# Patient Record
Sex: Male | Born: 1960 | Race: White | Hispanic: No
Health system: Southern US, Community
[De-identification: ages and names within clinical notes are randomized; demographics above are authoritative.]

---

## 2015-10-05 DIAGNOSIS — M19172 Post-traumatic osteoarthritis, left ankle and foot: Secondary | ICD-10-CM | POA: Diagnosis not present

## 2015-10-05 DIAGNOSIS — M19072 Primary osteoarthritis, left ankle and foot: Secondary | ICD-10-CM | POA: Diagnosis not present

## 2015-10-05 DIAGNOSIS — I10 Essential (primary) hypertension: Secondary | ICD-10-CM | POA: Diagnosis not present

## 2015-10-05 DIAGNOSIS — M12572 Traumatic arthropathy, left ankle and foot: Secondary | ICD-10-CM | POA: Diagnosis not present

## 2015-10-05 DIAGNOSIS — M21172 Varus deformity, not elsewhere classified, left ankle: Secondary | ICD-10-CM | POA: Diagnosis not present

## 2015-10-05 DIAGNOSIS — M216X2 Other acquired deformities of left foot: Secondary | ICD-10-CM | POA: Diagnosis not present

## 2015-10-05 DIAGNOSIS — M25572 Pain in left ankle and joints of left foot: Secondary | ICD-10-CM | POA: Diagnosis not present

## 2015-10-21 DIAGNOSIS — F419 Anxiety disorder, unspecified: Secondary | ICD-10-CM | POA: Diagnosis not present

## 2015-10-21 DIAGNOSIS — G894 Chronic pain syndrome: Secondary | ICD-10-CM | POA: Diagnosis not present

## 2015-10-21 DIAGNOSIS — M25579 Pain in unspecified ankle and joints of unspecified foot: Secondary | ICD-10-CM | POA: Diagnosis not present

## 2015-10-21 DIAGNOSIS — M129 Arthropathy, unspecified: Secondary | ICD-10-CM | POA: Diagnosis not present

## 2015-10-26 DIAGNOSIS — M216X2 Other acquired deformities of left foot: Secondary | ICD-10-CM | POA: Diagnosis not present

## 2015-10-26 DIAGNOSIS — Z79899 Other long term (current) drug therapy: Secondary | ICD-10-CM | POA: Diagnosis not present

## 2015-10-26 DIAGNOSIS — K219 Gastro-esophageal reflux disease without esophagitis: Secondary | ICD-10-CM | POA: Diagnosis not present

## 2015-10-26 DIAGNOSIS — M19172 Post-traumatic osteoarthritis, left ankle and foot: Secondary | ICD-10-CM | POA: Diagnosis not present

## 2015-10-26 DIAGNOSIS — G629 Polyneuropathy, unspecified: Secondary | ICD-10-CM | POA: Diagnosis not present

## 2015-10-26 DIAGNOSIS — M24572 Contracture, left ankle: Secondary | ICD-10-CM | POA: Diagnosis not present

## 2015-10-26 DIAGNOSIS — Z87442 Personal history of urinary calculi: Secondary | ICD-10-CM | POA: Diagnosis not present

## 2015-10-26 DIAGNOSIS — Z791 Long term (current) use of non-steroidal anti-inflammatories (NSAID): Secondary | ICD-10-CM | POA: Diagnosis not present

## 2015-10-26 DIAGNOSIS — R000 Tachycardia, unspecified: Secondary | ICD-10-CM | POA: Diagnosis not present

## 2015-10-26 DIAGNOSIS — I10 Essential (primary) hypertension: Secondary | ICD-10-CM | POA: Diagnosis not present

## 2015-10-28 DIAGNOSIS — M12572 Traumatic arthropathy, left ankle and foot: Secondary | ICD-10-CM | POA: Diagnosis not present

## 2015-10-28 DIAGNOSIS — M21172 Varus deformity, not elsewhere classified, left ankle: Secondary | ICD-10-CM | POA: Diagnosis not present

## 2015-10-28 DIAGNOSIS — M19071 Primary osteoarthritis, right ankle and foot: Secondary | ICD-10-CM | POA: Diagnosis not present

## 2015-10-28 DIAGNOSIS — M24572 Contracture, left ankle: Secondary | ICD-10-CM | POA: Diagnosis not present

## 2015-10-28 DIAGNOSIS — M216X2 Other acquired deformities of left foot: Secondary | ICD-10-CM | POA: Diagnosis not present

## 2015-10-28 DIAGNOSIS — M19172 Post-traumatic osteoarthritis, left ankle and foot: Secondary | ICD-10-CM | POA: Diagnosis not present

## 2015-10-28 DIAGNOSIS — G8918 Other acute postprocedural pain: Secondary | ICD-10-CM | POA: Diagnosis not present

## 2015-10-29 DIAGNOSIS — M19071 Primary osteoarthritis, right ankle and foot: Secondary | ICD-10-CM | POA: Diagnosis not present

## 2015-10-29 DIAGNOSIS — M12572 Traumatic arthropathy, left ankle and foot: Secondary | ICD-10-CM | POA: Diagnosis not present

## 2015-10-29 DIAGNOSIS — G8918 Other acute postprocedural pain: Secondary | ICD-10-CM | POA: Diagnosis not present

## 2015-10-29 DIAGNOSIS — M216X2 Other acquired deformities of left foot: Secondary | ICD-10-CM | POA: Diagnosis not present

## 2015-10-31 DIAGNOSIS — K219 Gastro-esophageal reflux disease without esophagitis: Secondary | ICD-10-CM | POA: Diagnosis not present

## 2015-10-31 DIAGNOSIS — G9341 Metabolic encephalopathy: Secondary | ICD-10-CM | POA: Diagnosis not present

## 2015-10-31 DIAGNOSIS — Z87898 Personal history of other specified conditions: Secondary | ICD-10-CM | POA: Diagnosis not present

## 2015-10-31 DIAGNOSIS — M25561 Pain in right knee: Secondary | ICD-10-CM | POA: Diagnosis not present

## 2015-10-31 DIAGNOSIS — G8918 Other acute postprocedural pain: Secondary | ICD-10-CM | POA: Diagnosis not present

## 2015-10-31 DIAGNOSIS — W19XXXA Unspecified fall, initial encounter: Secondary | ICD-10-CM | POA: Diagnosis not present

## 2015-10-31 DIAGNOSIS — J690 Pneumonitis due to inhalation of food and vomit: Secondary | ICD-10-CM | POA: Diagnosis not present

## 2015-10-31 DIAGNOSIS — I447 Left bundle-branch block, unspecified: Secondary | ICD-10-CM | POA: Diagnosis not present

## 2015-10-31 DIAGNOSIS — M25572 Pain in left ankle and joints of left foot: Secondary | ICD-10-CM | POA: Diagnosis not present

## 2015-10-31 DIAGNOSIS — G4733 Obstructive sleep apnea (adult) (pediatric): Secondary | ICD-10-CM | POA: Diagnosis not present

## 2015-10-31 DIAGNOSIS — N179 Acute kidney failure, unspecified: Secondary | ICD-10-CM | POA: Diagnosis not present

## 2015-10-31 DIAGNOSIS — A419 Sepsis, unspecified organism: Secondary | ICD-10-CM | POA: Diagnosis not present

## 2015-10-31 DIAGNOSIS — I10 Essential (primary) hypertension: Secondary | ICD-10-CM | POA: Diagnosis not present

## 2015-10-31 DIAGNOSIS — J9601 Acute respiratory failure with hypoxia: Secondary | ICD-10-CM | POA: Diagnosis not present

## 2015-10-31 DIAGNOSIS — I248 Other forms of acute ischemic heart disease: Secondary | ICD-10-CM | POA: Diagnosis not present

## 2015-10-31 DIAGNOSIS — M6282 Rhabdomyolysis: Secondary | ICD-10-CM | POA: Diagnosis not present

## 2015-10-31 DIAGNOSIS — G9340 Encephalopathy, unspecified: Secondary | ICD-10-CM | POA: Diagnosis not present

## 2015-10-31 DIAGNOSIS — R6520 Severe sepsis without septic shock: Secondary | ICD-10-CM | POA: Diagnosis not present

## 2015-10-31 DIAGNOSIS — G8929 Other chronic pain: Secondary | ICD-10-CM | POA: Diagnosis not present

## 2015-10-31 DIAGNOSIS — M25551 Pain in right hip: Secondary | ICD-10-CM | POA: Diagnosis not present

## 2015-10-31 DIAGNOSIS — M19172 Post-traumatic osteoarthritis, left ankle and foot: Secondary | ICD-10-CM | POA: Diagnosis not present

## 2015-10-31 DIAGNOSIS — F419 Anxiety disorder, unspecified: Secondary | ICD-10-CM | POA: Diagnosis not present

## 2015-11-01 DIAGNOSIS — J9601 Acute respiratory failure with hypoxia: Secondary | ICD-10-CM | POA: Diagnosis not present

## 2015-11-01 DIAGNOSIS — G8918 Other acute postprocedural pain: Secondary | ICD-10-CM | POA: Diagnosis not present

## 2015-11-01 DIAGNOSIS — G9341 Metabolic encephalopathy: Secondary | ICD-10-CM | POA: Diagnosis not present

## 2015-11-01 DIAGNOSIS — J690 Pneumonitis due to inhalation of food and vomit: Secondary | ICD-10-CM | POA: Diagnosis not present

## 2015-11-01 DIAGNOSIS — G8929 Other chronic pain: Secondary | ICD-10-CM | POA: Diagnosis not present

## 2015-11-01 DIAGNOSIS — N179 Acute kidney failure, unspecified: Secondary | ICD-10-CM | POA: Diagnosis not present

## 2015-11-01 DIAGNOSIS — M25572 Pain in left ankle and joints of left foot: Secondary | ICD-10-CM | POA: Diagnosis not present

## 2015-11-01 DIAGNOSIS — A419 Sepsis, unspecified organism: Secondary | ICD-10-CM | POA: Diagnosis not present

## 2015-11-01 DIAGNOSIS — G9340 Encephalopathy, unspecified: Secondary | ICD-10-CM | POA: Diagnosis not present

## 2015-11-02 DIAGNOSIS — I10 Essential (primary) hypertension: Secondary | ICD-10-CM | POA: Diagnosis not present

## 2015-11-02 DIAGNOSIS — R4182 Altered mental status, unspecified: Secondary | ICD-10-CM | POA: Diagnosis not present

## 2015-11-02 DIAGNOSIS — N179 Acute kidney failure, unspecified: Secondary | ICD-10-CM | POA: Diagnosis not present

## 2015-11-02 DIAGNOSIS — G9341 Metabolic encephalopathy: Secondary | ICD-10-CM | POA: Diagnosis not present

## 2015-11-02 DIAGNOSIS — M79604 Pain in right leg: Secondary | ICD-10-CM | POA: Diagnosis not present

## 2015-11-02 DIAGNOSIS — R6520 Severe sepsis without septic shock: Secondary | ICD-10-CM | POA: Diagnosis not present

## 2015-11-02 DIAGNOSIS — M6282 Rhabdomyolysis: Secondary | ICD-10-CM | POA: Diagnosis not present

## 2015-11-02 DIAGNOSIS — A419 Sepsis, unspecified organism: Secondary | ICD-10-CM | POA: Diagnosis not present

## 2015-11-02 DIAGNOSIS — M79605 Pain in left leg: Secondary | ICD-10-CM | POA: Diagnosis not present

## 2015-11-02 DIAGNOSIS — G8929 Other chronic pain: Secondary | ICD-10-CM | POA: Diagnosis not present

## 2015-11-02 DIAGNOSIS — M25572 Pain in left ankle and joints of left foot: Secondary | ICD-10-CM | POA: Diagnosis not present

## 2015-11-02 DIAGNOSIS — R7989 Other specified abnormal findings of blood chemistry: Secondary | ICD-10-CM | POA: Diagnosis not present

## 2015-11-02 DIAGNOSIS — J9601 Acute respiratory failure with hypoxia: Secondary | ICD-10-CM | POA: Diagnosis not present

## 2015-11-03 DIAGNOSIS — G9341 Metabolic encephalopathy: Secondary | ICD-10-CM | POA: Diagnosis not present

## 2015-11-03 DIAGNOSIS — I10 Essential (primary) hypertension: Secondary | ICD-10-CM | POA: Diagnosis not present

## 2015-11-03 DIAGNOSIS — Z7409 Other reduced mobility: Secondary | ICD-10-CM | POA: Diagnosis not present

## 2015-11-03 DIAGNOSIS — K219 Gastro-esophageal reflux disease without esophagitis: Secondary | ICD-10-CM | POA: Diagnosis not present

## 2015-11-03 DIAGNOSIS — R7989 Other specified abnormal findings of blood chemistry: Secondary | ICD-10-CM | POA: Diagnosis not present

## 2015-11-03 DIAGNOSIS — E876 Hypokalemia: Secondary | ICD-10-CM | POA: Diagnosis not present

## 2015-11-03 DIAGNOSIS — A419 Sepsis, unspecified organism: Secondary | ICD-10-CM | POA: Diagnosis not present

## 2015-11-03 DIAGNOSIS — N179 Acute kidney failure, unspecified: Secondary | ICD-10-CM | POA: Diagnosis not present

## 2015-11-03 DIAGNOSIS — M25561 Pain in right knee: Secondary | ICD-10-CM | POA: Diagnosis not present

## 2015-11-03 DIAGNOSIS — M25551 Pain in right hip: Secondary | ICD-10-CM | POA: Diagnosis not present

## 2015-11-03 DIAGNOSIS — J9601 Acute respiratory failure with hypoxia: Secondary | ICD-10-CM | POA: Diagnosis not present

## 2015-11-03 DIAGNOSIS — M6282 Rhabdomyolysis: Secondary | ICD-10-CM | POA: Diagnosis not present

## 2015-11-03 DIAGNOSIS — R4182 Altered mental status, unspecified: Secondary | ICD-10-CM | POA: Diagnosis not present

## 2015-11-04 DIAGNOSIS — S50312A Abrasion of left elbow, initial encounter: Secondary | ICD-10-CM | POA: Diagnosis not present

## 2015-11-04 DIAGNOSIS — R7989 Other specified abnormal findings of blood chemistry: Secondary | ICD-10-CM | POA: Diagnosis not present

## 2015-11-04 DIAGNOSIS — M6282 Rhabdomyolysis: Secondary | ICD-10-CM | POA: Diagnosis not present

## 2015-11-04 DIAGNOSIS — M79605 Pain in left leg: Secondary | ICD-10-CM | POA: Diagnosis not present

## 2015-11-04 DIAGNOSIS — I10 Essential (primary) hypertension: Secondary | ICD-10-CM | POA: Diagnosis not present

## 2015-11-04 DIAGNOSIS — M79604 Pain in right leg: Secondary | ICD-10-CM | POA: Diagnosis not present

## 2015-11-04 DIAGNOSIS — A419 Sepsis, unspecified organism: Secondary | ICD-10-CM | POA: Diagnosis not present

## 2015-11-04 DIAGNOSIS — G9341 Metabolic encephalopathy: Secondary | ICD-10-CM | POA: Diagnosis not present

## 2015-11-04 DIAGNOSIS — M25551 Pain in right hip: Secondary | ICD-10-CM | POA: Diagnosis not present

## 2015-11-04 DIAGNOSIS — J9601 Acute respiratory failure with hypoxia: Secondary | ICD-10-CM | POA: Diagnosis not present

## 2015-11-04 DIAGNOSIS — K219 Gastro-esophageal reflux disease without esophagitis: Secondary | ICD-10-CM | POA: Diagnosis not present

## 2015-11-04 DIAGNOSIS — M25561 Pain in right knee: Secondary | ICD-10-CM | POA: Diagnosis not present

## 2015-11-04 DIAGNOSIS — N179 Acute kidney failure, unspecified: Secondary | ICD-10-CM | POA: Diagnosis not present

## 2015-11-05 DIAGNOSIS — A419 Sepsis, unspecified organism: Secondary | ICD-10-CM | POA: Diagnosis not present

## 2015-11-05 DIAGNOSIS — K219 Gastro-esophageal reflux disease without esophagitis: Secondary | ICD-10-CM | POA: Diagnosis not present

## 2015-11-05 DIAGNOSIS — S7290XA Unspecified fracture of unspecified femur, initial encounter for closed fracture: Secondary | ICD-10-CM | POA: Diagnosis not present

## 2015-11-05 DIAGNOSIS — S79919S Unspecified injury of unspecified hip, sequela: Secondary | ICD-10-CM | POA: Diagnosis not present

## 2015-11-05 DIAGNOSIS — N179 Acute kidney failure, unspecified: Secondary | ICD-10-CM | POA: Diagnosis not present

## 2015-11-05 DIAGNOSIS — M19072 Primary osteoarthritis, left ankle and foot: Secondary | ICD-10-CM | POA: Diagnosis not present

## 2015-11-05 DIAGNOSIS — M6282 Rhabdomyolysis: Secondary | ICD-10-CM | POA: Diagnosis not present

## 2015-11-05 DIAGNOSIS — J690 Pneumonitis due to inhalation of food and vomit: Secondary | ICD-10-CM | POA: Diagnosis not present

## 2015-11-05 DIAGNOSIS — F411 Generalized anxiety disorder: Secondary | ICD-10-CM | POA: Diagnosis not present

## 2015-11-05 DIAGNOSIS — K5900 Constipation, unspecified: Secondary | ICD-10-CM | POA: Diagnosis not present

## 2015-11-05 DIAGNOSIS — M19172 Post-traumatic osteoarthritis, left ankle and foot: Secondary | ICD-10-CM | POA: Diagnosis not present

## 2015-11-05 DIAGNOSIS — G9341 Metabolic encephalopathy: Secondary | ICD-10-CM | POA: Diagnosis not present

## 2015-11-05 DIAGNOSIS — I10 Essential (primary) hypertension: Secondary | ICD-10-CM | POA: Diagnosis not present

## 2015-11-05 DIAGNOSIS — J9601 Acute respiratory failure with hypoxia: Secondary | ICD-10-CM | POA: Diagnosis not present

## 2015-11-20 DIAGNOSIS — I10 Essential (primary) hypertension: Secondary | ICD-10-CM | POA: Diagnosis not present

## 2015-11-20 DIAGNOSIS — M12572 Traumatic arthropathy, left ankle and foot: Secondary | ICD-10-CM | POA: Diagnosis not present

## 2015-11-20 DIAGNOSIS — Z981 Arthrodesis status: Secondary | ICD-10-CM | POA: Diagnosis not present

## 2015-11-20 DIAGNOSIS — Z7982 Long term (current) use of aspirin: Secondary | ICD-10-CM | POA: Diagnosis not present

## 2015-11-20 DIAGNOSIS — M19072 Primary osteoarthritis, left ankle and foot: Secondary | ICD-10-CM | POA: Diagnosis not present

## 2015-11-20 DIAGNOSIS — Z4789 Encounter for other orthopedic aftercare: Secondary | ICD-10-CM | POA: Diagnosis not present

## 2015-11-25 DIAGNOSIS — N179 Acute kidney failure, unspecified: Secondary | ICD-10-CM | POA: Diagnosis not present

## 2015-11-25 DIAGNOSIS — M19072 Primary osteoarthritis, left ankle and foot: Secondary | ICD-10-CM | POA: Diagnosis not present

## 2015-11-25 DIAGNOSIS — F411 Generalized anxiety disorder: Secondary | ICD-10-CM | POA: Diagnosis not present

## 2015-11-25 DIAGNOSIS — M6282 Rhabdomyolysis: Secondary | ICD-10-CM | POA: Diagnosis not present

## 2015-11-25 DIAGNOSIS — M19172 Post-traumatic osteoarthritis, left ankle and foot: Secondary | ICD-10-CM | POA: Diagnosis not present

## 2015-11-25 DIAGNOSIS — J690 Pneumonitis due to inhalation of food and vomit: Secondary | ICD-10-CM | POA: Diagnosis not present

## 2015-11-25 DIAGNOSIS — K5900 Constipation, unspecified: Secondary | ICD-10-CM | POA: Diagnosis not present

## 2015-11-25 DIAGNOSIS — K219 Gastro-esophageal reflux disease without esophagitis: Secondary | ICD-10-CM | POA: Diagnosis not present

## 2015-11-25 DIAGNOSIS — I10 Essential (primary) hypertension: Secondary | ICD-10-CM | POA: Diagnosis not present

## 2015-12-10 DIAGNOSIS — M12572 Traumatic arthropathy, left ankle and foot: Secondary | ICD-10-CM | POA: Diagnosis not present

## 2015-12-10 DIAGNOSIS — Z981 Arthrodesis status: Secondary | ICD-10-CM | POA: Diagnosis not present

## 2015-12-10 DIAGNOSIS — Z4789 Encounter for other orthopedic aftercare: Secondary | ICD-10-CM | POA: Diagnosis not present

## 2015-12-10 DIAGNOSIS — M19072 Primary osteoarthritis, left ankle and foot: Secondary | ICD-10-CM | POA: Diagnosis not present

## 2015-12-10 DIAGNOSIS — M216X2 Other acquired deformities of left foot: Secondary | ICD-10-CM | POA: Diagnosis not present

## 2015-12-29 DIAGNOSIS — M19072 Primary osteoarthritis, left ankle and foot: Secondary | ICD-10-CM | POA: Diagnosis not present

## 2015-12-29 DIAGNOSIS — Z4789 Encounter for other orthopedic aftercare: Secondary | ICD-10-CM | POA: Diagnosis not present

## 2015-12-29 DIAGNOSIS — M24072 Loose body in left ankle: Secondary | ICD-10-CM | POA: Diagnosis not present

## 2016-01-12 DIAGNOSIS — G894 Chronic pain syndrome: Secondary | ICD-10-CM | POA: Diagnosis not present

## 2016-02-09 DIAGNOSIS — I959 Hypotension, unspecified: Secondary | ICD-10-CM | POA: Diagnosis not present

## 2016-02-09 DIAGNOSIS — E860 Dehydration: Secondary | ICD-10-CM | POA: Diagnosis not present

## 2016-02-09 DIAGNOSIS — J189 Pneumonia, unspecified organism: Secondary | ICD-10-CM | POA: Diagnosis not present

## 2016-02-09 DIAGNOSIS — N289 Disorder of kidney and ureter, unspecified: Secondary | ICD-10-CM | POA: Diagnosis not present

## 2016-02-09 DIAGNOSIS — G894 Chronic pain syndrome: Secondary | ICD-10-CM | POA: Diagnosis not present

## 2016-02-28 DIAGNOSIS — R079 Chest pain, unspecified: Secondary | ICD-10-CM | POA: Diagnosis not present

## 2016-02-28 DIAGNOSIS — M79672 Pain in left foot: Secondary | ICD-10-CM | POA: Diagnosis not present

## 2016-02-28 DIAGNOSIS — R0602 Shortness of breath: Secondary | ICD-10-CM | POA: Diagnosis not present

## 2016-02-28 DIAGNOSIS — R938 Abnormal findings on diagnostic imaging of other specified body structures: Secondary | ICD-10-CM | POA: Diagnosis not present

## 2016-02-28 DIAGNOSIS — R110 Nausea: Secondary | ICD-10-CM | POA: Diagnosis not present

## 2016-02-28 DIAGNOSIS — R0789 Other chest pain: Secondary | ICD-10-CM | POA: Diagnosis not present

## 2016-02-28 DIAGNOSIS — M79675 Pain in left toe(s): Secondary | ICD-10-CM | POA: Diagnosis not present

## 2016-02-28 DIAGNOSIS — M19072 Primary osteoarthritis, left ankle and foot: Secondary | ICD-10-CM | POA: Diagnosis not present

## 2016-02-28 DIAGNOSIS — I10 Essential (primary) hypertension: Secondary | ICD-10-CM | POA: Diagnosis not present

## 2016-05-16 DIAGNOSIS — G894 Chronic pain syndrome: Secondary | ICD-10-CM | POA: Diagnosis not present

## 2016-05-16 DIAGNOSIS — I10 Essential (primary) hypertension: Secondary | ICD-10-CM | POA: Diagnosis not present

## 2016-05-16 DIAGNOSIS — F419 Anxiety disorder, unspecified: Secondary | ICD-10-CM | POA: Diagnosis not present

## 2016-07-21 DIAGNOSIS — M1990 Unspecified osteoarthritis, unspecified site: Secondary | ICD-10-CM | POA: Diagnosis not present

## 2016-07-21 DIAGNOSIS — F419 Anxiety disorder, unspecified: Secondary | ICD-10-CM | POA: Diagnosis not present

## 2016-07-21 DIAGNOSIS — K219 Gastro-esophageal reflux disease without esophagitis: Secondary | ICD-10-CM | POA: Diagnosis not present

## 2016-07-21 DIAGNOSIS — G894 Chronic pain syndrome: Secondary | ICD-10-CM | POA: Diagnosis not present

## 2016-08-25 DIAGNOSIS — M25579 Pain in unspecified ankle and joints of unspecified foot: Secondary | ICD-10-CM | POA: Diagnosis not present

## 2016-08-25 DIAGNOSIS — F419 Anxiety disorder, unspecified: Secondary | ICD-10-CM | POA: Diagnosis not present

## 2016-08-25 DIAGNOSIS — I10 Essential (primary) hypertension: Secondary | ICD-10-CM | POA: Diagnosis not present

## 2016-08-25 DIAGNOSIS — G894 Chronic pain syndrome: Secondary | ICD-10-CM | POA: Diagnosis not present

## 2016-08-25 DIAGNOSIS — M02361 Reiter's disease, right knee: Secondary | ICD-10-CM | POA: Diagnosis not present

## 2016-08-25 DIAGNOSIS — I973 Postprocedural hypertension: Secondary | ICD-10-CM | POA: Diagnosis not present

## 2016-08-25 DIAGNOSIS — M5136 Other intervertebral disc degeneration, lumbar region: Secondary | ICD-10-CM | POA: Diagnosis not present

## 2016-09-21 DIAGNOSIS — I10 Essential (primary) hypertension: Secondary | ICD-10-CM | POA: Diagnosis not present

## 2016-09-21 DIAGNOSIS — E669 Obesity, unspecified: Secondary | ICD-10-CM | POA: Diagnosis not present

## 2016-09-21 DIAGNOSIS — M1990 Unspecified osteoarthritis, unspecified site: Secondary | ICD-10-CM | POA: Diagnosis not present

## 2016-09-21 DIAGNOSIS — I973 Postprocedural hypertension: Secondary | ICD-10-CM | POA: Diagnosis not present

## 2016-09-21 DIAGNOSIS — G894 Chronic pain syndrome: Secondary | ICD-10-CM | POA: Diagnosis not present

## 2016-09-21 DIAGNOSIS — F419 Anxiety disorder, unspecified: Secondary | ICD-10-CM | POA: Diagnosis not present

## 2016-10-19 DIAGNOSIS — M5136 Other intervertebral disc degeneration, lumbar region: Secondary | ICD-10-CM | POA: Diagnosis not present

## 2016-10-19 DIAGNOSIS — G894 Chronic pain syndrome: Secondary | ICD-10-CM | POA: Diagnosis not present

## 2016-10-19 DIAGNOSIS — M1991 Primary osteoarthritis, unspecified site: Secondary | ICD-10-CM | POA: Diagnosis not present

## 2016-10-19 DIAGNOSIS — F419 Anxiety disorder, unspecified: Secondary | ICD-10-CM | POA: Diagnosis not present

## 2016-11-23 DIAGNOSIS — F419 Anxiety disorder, unspecified: Secondary | ICD-10-CM | POA: Diagnosis not present

## 2016-11-23 DIAGNOSIS — G894 Chronic pain syndrome: Secondary | ICD-10-CM | POA: Diagnosis not present

## 2016-11-23 DIAGNOSIS — I10 Essential (primary) hypertension: Secondary | ICD-10-CM | POA: Diagnosis not present

## 2016-11-23 DIAGNOSIS — M1991 Primary osteoarthritis, unspecified site: Secondary | ICD-10-CM | POA: Diagnosis not present

## 2016-12-21 DIAGNOSIS — G894 Chronic pain syndrome: Secondary | ICD-10-CM | POA: Diagnosis not present

## 2016-12-21 DIAGNOSIS — M1991 Primary osteoarthritis, unspecified site: Secondary | ICD-10-CM | POA: Diagnosis not present

## 2016-12-21 DIAGNOSIS — I10 Essential (primary) hypertension: Secondary | ICD-10-CM | POA: Diagnosis not present

## 2016-12-21 DIAGNOSIS — F419 Anxiety disorder, unspecified: Secondary | ICD-10-CM | POA: Diagnosis not present

## 2017-01-19 DIAGNOSIS — M1991 Primary osteoarthritis, unspecified site: Secondary | ICD-10-CM | POA: Diagnosis not present

## 2017-01-19 DIAGNOSIS — G4700 Insomnia, unspecified: Secondary | ICD-10-CM | POA: Diagnosis not present

## 2017-01-19 DIAGNOSIS — G609 Hereditary and idiopathic neuropathy, unspecified: Secondary | ICD-10-CM | POA: Diagnosis not present

## 2017-01-19 DIAGNOSIS — F419 Anxiety disorder, unspecified: Secondary | ICD-10-CM | POA: Diagnosis not present

## 2017-01-19 DIAGNOSIS — I10 Essential (primary) hypertension: Secondary | ICD-10-CM | POA: Diagnosis not present

## 2017-01-19 DIAGNOSIS — G894 Chronic pain syndrome: Secondary | ICD-10-CM | POA: Diagnosis not present

## 2017-01-22 DIAGNOSIS — R0789 Other chest pain: Secondary | ICD-10-CM | POA: Diagnosis not present

## 2017-01-22 DIAGNOSIS — R0602 Shortness of breath: Secondary | ICD-10-CM | POA: Diagnosis not present

## 2017-01-22 DIAGNOSIS — R079 Chest pain, unspecified: Secondary | ICD-10-CM | POA: Diagnosis not present

## 2017-02-15 DIAGNOSIS — G894 Chronic pain syndrome: Secondary | ICD-10-CM | POA: Diagnosis not present

## 2017-02-15 DIAGNOSIS — K219 Gastro-esophageal reflux disease without esophagitis: Secondary | ICD-10-CM | POA: Diagnosis not present

## 2017-02-15 DIAGNOSIS — M1991 Primary osteoarthritis, unspecified site: Secondary | ICD-10-CM | POA: Diagnosis not present

## 2017-02-15 DIAGNOSIS — I10 Essential (primary) hypertension: Secondary | ICD-10-CM | POA: Diagnosis not present

## 2017-02-16 DIAGNOSIS — M19072 Primary osteoarthritis, left ankle and foot: Secondary | ICD-10-CM | POA: Diagnosis not present

## 2017-02-16 DIAGNOSIS — Z981 Arthrodesis status: Secondary | ICD-10-CM | POA: Diagnosis not present

## 2017-02-16 DIAGNOSIS — G629 Polyneuropathy, unspecified: Secondary | ICD-10-CM | POA: Diagnosis not present

## 2017-02-16 DIAGNOSIS — M19172 Post-traumatic osteoarthritis, left ankle and foot: Secondary | ICD-10-CM | POA: Diagnosis not present

## 2017-02-16 DIAGNOSIS — M21862 Other specified acquired deformities of left lower leg: Secondary | ICD-10-CM | POA: Diagnosis not present

## 2017-02-16 DIAGNOSIS — G5792 Unspecified mononeuropathy of left lower limb: Secondary | ICD-10-CM | POA: Diagnosis not present

## 2017-03-08 DIAGNOSIS — R1012 Left upper quadrant pain: Secondary | ICD-10-CM | POA: Diagnosis not present

## 2017-03-08 DIAGNOSIS — R110 Nausea: Secondary | ICD-10-CM | POA: Diagnosis not present

## 2017-03-08 DIAGNOSIS — R0781 Pleurodynia: Secondary | ICD-10-CM | POA: Diagnosis not present

## 2017-03-08 DIAGNOSIS — I10 Essential (primary) hypertension: Secondary | ICD-10-CM | POA: Diagnosis not present

## 2017-03-08 DIAGNOSIS — R079 Chest pain, unspecified: Secondary | ICD-10-CM | POA: Diagnosis not present

## 2017-03-08 DIAGNOSIS — R109 Unspecified abdominal pain: Secondary | ICD-10-CM | POA: Diagnosis not present

## 2017-03-08 DIAGNOSIS — Z79899 Other long term (current) drug therapy: Secondary | ICD-10-CM | POA: Diagnosis not present

## 2017-03-08 DIAGNOSIS — R1032 Left lower quadrant pain: Secondary | ICD-10-CM | POA: Diagnosis not present

## 2017-03-16 DIAGNOSIS — G894 Chronic pain syndrome: Secondary | ICD-10-CM | POA: Diagnosis not present

## 2017-03-16 DIAGNOSIS — I10 Essential (primary) hypertension: Secondary | ICD-10-CM | POA: Diagnosis not present

## 2017-03-16 DIAGNOSIS — M1991 Primary osteoarthritis, unspecified site: Secondary | ICD-10-CM | POA: Diagnosis not present

## 2017-03-16 DIAGNOSIS — K219 Gastro-esophageal reflux disease without esophagitis: Secondary | ICD-10-CM | POA: Diagnosis not present

## 2017-05-11 DIAGNOSIS — M79661 Pain in right lower leg: Secondary | ICD-10-CM | POA: Diagnosis not present

## 2017-05-11 DIAGNOSIS — N170 Acute kidney failure with tubular necrosis: Secondary | ICD-10-CM | POA: Diagnosis not present

## 2017-05-11 DIAGNOSIS — N179 Acute kidney failure, unspecified: Secondary | ICD-10-CM | POA: Diagnosis not present

## 2017-05-11 DIAGNOSIS — I959 Hypotension, unspecified: Secondary | ICD-10-CM | POA: Diagnosis not present

## 2017-05-11 DIAGNOSIS — I10 Essential (primary) hypertension: Secondary | ICD-10-CM | POA: Diagnosis not present

## 2017-05-11 DIAGNOSIS — N390 Urinary tract infection, site not specified: Secondary | ICD-10-CM | POA: Diagnosis not present

## 2017-05-11 DIAGNOSIS — R109 Unspecified abdominal pain: Secondary | ICD-10-CM | POA: Diagnosis not present

## 2017-05-11 DIAGNOSIS — G8929 Other chronic pain: Secondary | ICD-10-CM | POA: Diagnosis not present

## 2017-05-11 DIAGNOSIS — Z79899 Other long term (current) drug therapy: Secondary | ICD-10-CM | POA: Diagnosis not present

## 2017-05-11 DIAGNOSIS — E860 Dehydration: Secondary | ICD-10-CM | POA: Diagnosis not present

## 2017-05-11 DIAGNOSIS — R531 Weakness: Secondary | ICD-10-CM | POA: Diagnosis not present

## 2017-05-12 DIAGNOSIS — E860 Dehydration: Secondary | ICD-10-CM | POA: Diagnosis not present

## 2017-05-12 DIAGNOSIS — N179 Acute kidney failure, unspecified: Secondary | ICD-10-CM | POA: Diagnosis not present

## 2017-05-30 DIAGNOSIS — R109 Unspecified abdominal pain: Secondary | ICD-10-CM | POA: Diagnosis not present

## 2017-06-16 DIAGNOSIS — K591 Functional diarrhea: Secondary | ICD-10-CM | POA: Diagnosis not present

## 2017-06-16 DIAGNOSIS — M549 Dorsalgia, unspecified: Secondary | ICD-10-CM | POA: Diagnosis not present

## 2017-06-16 DIAGNOSIS — R109 Unspecified abdominal pain: Secondary | ICD-10-CM | POA: Diagnosis not present

## 2017-06-16 DIAGNOSIS — R51 Headache: Secondary | ICD-10-CM | POA: Diagnosis not present

## 2017-06-29 DIAGNOSIS — R109 Unspecified abdominal pain: Secondary | ICD-10-CM | POA: Diagnosis not present

## 2017-06-29 DIAGNOSIS — N289 Disorder of kidney and ureter, unspecified: Secondary | ICD-10-CM | POA: Diagnosis not present

## 2017-06-29 DIAGNOSIS — I10 Essential (primary) hypertension: Secondary | ICD-10-CM | POA: Diagnosis not present

## 2017-06-29 DIAGNOSIS — Z79899 Other long term (current) drug therapy: Secondary | ICD-10-CM | POA: Diagnosis not present

## 2017-06-29 DIAGNOSIS — M545 Low back pain: Secondary | ICD-10-CM | POA: Diagnosis not present

## 2017-06-29 DIAGNOSIS — M5440 Lumbago with sciatica, unspecified side: Secondary | ICD-10-CM | POA: Diagnosis not present

## 2017-06-29 DIAGNOSIS — R1032 Left lower quadrant pain: Secondary | ICD-10-CM | POA: Diagnosis not present

## 2017-11-09 DIAGNOSIS — K219 Gastro-esophageal reflux disease without esophagitis: Secondary | ICD-10-CM | POA: Diagnosis not present

## 2017-11-09 DIAGNOSIS — I10 Essential (primary) hypertension: Secondary | ICD-10-CM | POA: Diagnosis not present

## 2017-11-09 DIAGNOSIS — R918 Other nonspecific abnormal finding of lung field: Secondary | ICD-10-CM | POA: Diagnosis not present

## 2017-11-09 DIAGNOSIS — R911 Solitary pulmonary nodule: Secondary | ICD-10-CM | POA: Diagnosis not present

## 2017-11-09 DIAGNOSIS — I2510 Atherosclerotic heart disease of native coronary artery without angina pectoris: Secondary | ICD-10-CM | POA: Diagnosis not present

## 2017-11-09 DIAGNOSIS — F17220 Nicotine dependence, chewing tobacco, uncomplicated: Secondary | ICD-10-CM | POA: Diagnosis not present

## 2017-11-09 DIAGNOSIS — R0789 Other chest pain: Secondary | ICD-10-CM | POA: Diagnosis not present

## 2017-11-09 DIAGNOSIS — R202 Paresthesia of skin: Secondary | ICD-10-CM | POA: Diagnosis not present

## 2017-11-09 DIAGNOSIS — E785 Hyperlipidemia, unspecified: Secondary | ICD-10-CM | POA: Diagnosis not present

## 2017-11-09 DIAGNOSIS — R000 Tachycardia, unspecified: Secondary | ICD-10-CM | POA: Diagnosis not present

## 2017-11-09 DIAGNOSIS — I219 Acute myocardial infarction, unspecified: Secondary | ICD-10-CM | POA: Diagnosis not present

## 2017-11-09 DIAGNOSIS — R110 Nausea: Secondary | ICD-10-CM | POA: Diagnosis not present

## 2017-11-09 DIAGNOSIS — I200 Unstable angina: Secondary | ICD-10-CM | POA: Diagnosis not present

## 2017-11-09 DIAGNOSIS — Z8249 Family history of ischemic heart disease and other diseases of the circulatory system: Secondary | ICD-10-CM | POA: Diagnosis not present

## 2017-11-09 DIAGNOSIS — R079 Chest pain, unspecified: Secondary | ICD-10-CM | POA: Diagnosis not present

## 2017-11-09 DIAGNOSIS — R61 Generalized hyperhidrosis: Secondary | ICD-10-CM | POA: Diagnosis not present

## 2020-01-17 ENCOUNTER — Ambulatory Visit: Payer: Medicare HMO | Admitting: Sports Medicine

## 2020-01-17 ENCOUNTER — Ambulatory Visit
Admission: RE | Admit: 2020-01-17 | Discharge: 2020-01-17 | Disposition: A | Discharge status: Home / Self Care | Payer: Medicare HMO | Source: Ambulatory Visit | Attending: Sports Medicine | Admitting: Sports Medicine

## 2020-01-17 ENCOUNTER — Encounter: Payer: Self-pay | Admitting: Sports Medicine

## 2020-01-17 ENCOUNTER — Other Ambulatory Visit: Payer: Self-pay

## 2020-01-17 VITALS — BP 132/88 | Ht 70.0 in | Wt 230.0 lb

## 2020-01-17 DIAGNOSIS — M79605 Pain in left leg: Secondary | ICD-10-CM

## 2020-01-17 NOTE — Patient Instructions (Addendum)
You have a stress fracture in your distal tibia.   You can take NSAIDs/Tylenol for pain as needed. We are giving you an air cast to wear on your left foot. We are also giving you crutches to use to take weight off of your left foot until your pain has decreased.  We are also sending you for X-rays of your leg.  Follow-up in office in 4 weeks, or call if your leg/ankle begins to hurt significantly more.

## 2020-01-17 NOTE — Progress Notes (Signed)
Order place for tib/fib images

## 2020-01-17 NOTE — Progress Notes (Addendum)
PCP: No primary care provider on file.  Subjective:   HPI: Patient is a 59 y.o. male here for evaluation of :eftdistal tibia pain.  The pain started approximately 2 to 3 weeks ago.  He denies any specific injury or trauma but notes the pain is been worsening with activity.  Of note patient has a history of ankle fusion in 2016 with hardware removal 1 year ago.  Patient notes the pain does not radiate.  He denies any bruising or swelling.  He has no numbness or tingling.  He has not done anything for the pain.  Patient notes the pain is best in the morning and worsens throughout the day.   Review of Systems: See HPI above.  History reviewed. No pertinent past medical history.  No current outpatient medications on file prior to visit.   No current facility-administered medications on file prior to visit.    History reviewed. No pertinent surgical history.  No Known Allergies  Social History   Socioeconomic History  . Marital status: Legally Separated    Spouse name: Not on file  . Number of children: Not on file  . Years of education: Not on file  . Highest education level: Not on file  Occupational History  . Not on file  Tobacco Use  . Smoking status: Not on file  Substance and Sexual Activity  . Alcohol use: Not on file  . Drug use: Not on file  . Sexual activity: Not on file  Other Topics Concern  . Not on file  Social History Narrative  . Not on file   Social Determinants of Health   Financial Resource Strain:   . Difficulty of Paying Living Expenses:   Food Insecurity:   . Worried About Programme researcher, broadcasting/film/video in the Last Year:   . Barista in the Last Year:   Transportation Needs:   . Freight forwarder (Medical):   Marland Kitchen Lack of Transportation (Non-Medical):   Physical Activity:   . Days of Exercise per Week:   . Minutes of Exercise per Session:   Stress:   . Feeling of Stress :   Social Connections:   . Frequency of Communication with Friends and  Family:   . Frequency of Social Gatherings with Friends and Family:   . Attends Religious Services:   . Active Member of Clubs or Organizations:   . Attends Banker Meetings:   Marland Kitchen Marital Status:   Intimate Partner Violence:   . Fear of Current or Ex-Partner:   . Emotionally Abused:   Marland Kitchen Physically Abused:   . Sexually Abused:     History reviewed. No pertinent family history.      Objective:  Physical Exam: BP 132/88   Ht 5\' 10"  (1.778 m)   Wt 230 lb (104.3 kg)   BMI 33.00 kg/m  Gen: NAD, comfortable in exam room Lungs: Breathing comfortably on room air Left lower extremity exam -Patient with surgical scar along the distal medial tibia.  No erythema, no gross deformity.  Patient has normal longitudinal transverse arch.  Foot is in normal alignment -Patient without any motion at the ankle joint but does have normal range of motion at the subtalar joint in all directions -Patient has tenderness palpation along the distal medial tibia -Patient's right foot is neurovascularly intact  Limited diagnostic ultrasound of the Left lower extremity Findings: -Postoperative changes at the ankle joint with significant spurring seen -Cortical irregularity noted along the distal medial tibia.  There is no increased neovascularization at this area however this was seen in both long and short axis Impression: -Ultrasound findings consistent with stress fracture   Assessment & Plan:  Patient is a 59 y.o. male here for Left distal tibia pain  1.  Left distal tibia stress fracture -Ultrasound findings consistent with previous stress fracture of the distal tibia -X-rays were ordered to better evaluate the stress fracture -Patient was given an Aircast to wear while walking -Patient was given crutches for symptomatic relief.  He will use these for the next 1 to 2 weeks as needed -Patient will follow up in 1 month for repeat evaluation  I was the preceptor for this visit and  available for immediate consultation Shellia Cleverly, DO

## 2020-02-12 ENCOUNTER — Other Ambulatory Visit: Payer: Self-pay

## 2020-02-12 ENCOUNTER — Ambulatory Visit: Payer: Medicare HMO | Admitting: Sports Medicine

## 2020-02-12 ENCOUNTER — Encounter: Payer: Self-pay | Admitting: Sports Medicine

## 2020-02-12 VITALS — BP 124/70 | Ht 70.0 in | Wt 234.8 lb

## 2020-02-12 DIAGNOSIS — M898X6 Other specified disorders of bone, lower leg: Secondary | ICD-10-CM | POA: Diagnosis not present

## 2020-02-12 NOTE — Progress Notes (Addendum)
PCP: Patient, No Pcp Per  Subjective:   HPI: Patient is a 59 y.o. male here for follow-up of left tibia pain. Patient was seen 1 month ago with concerns of a stress injury to the distal tibia. Patient has a history of tibiotalar and subtalar ankle fusions. He had hardware removed and 2020 from his ankle. Ultrasound showed prominence on the distal tibia. Patient also had an x-ray done showing no acute injury to the ankle however there were postoperative changes from his ankle fusion. Patient was put in an Aircast and instructed to follow-up after 1 month. Patient notes worsening of the pain since his last visit. He notes any sort of pressure standing or walking seems to worsen his pain. Patient denies any numbness or tingling in his leg. Denies any bruising or swelling. Over-the-counter anti-inflammatories have not improved his pain.   Review of Systems: See HPI above.  History reviewed. No pertinent past medical history.  No current outpatient medications on file prior to visit.   No current facility-administered medications on file prior to visit.    History reviewed. No pertinent surgical history.  No Known Allergies  Social History   Socioeconomic History  . Marital status: Legally Separated    Spouse name: Not on file  . Number of children: Not on file  . Years of education: Not on file  . Highest education level: Not on file  Occupational History  . Not on file  Tobacco Use  . Smoking status: Not on file  Substance and Sexual Activity  . Alcohol use: Not on file  . Drug use: Not on file  . Sexual activity: Not on file  Other Topics Concern  . Not on file  Social History Narrative  . Not on file   Social Determinants of Health   Financial Resource Strain:   . Difficulty of Paying Living Expenses:   Food Insecurity:   . Worried About Programme researcher, broadcasting/film/video in the Last Year:   . Barista in the Last Year:   Transportation Needs:   . Freight forwarder  (Medical):   Marland Kitchen Lack of Transportation (Non-Medical):   Physical Activity:   . Days of Exercise per Week:   . Minutes of Exercise per Session:   Stress:   . Feeling of Stress :   Social Connections:   . Frequency of Communication with Friends and Family:   . Frequency of Social Gatherings with Friends and Family:   . Attends Religious Services:   . Active Member of Clubs or Organizations:   . Attends Banker Meetings:   Marland Kitchen Marital Status:   Intimate Partner Violence:   . Fear of Current or Ex-Partner:   . Emotionally Abused:   Marland Kitchen Physically Abused:   . Sexually Abused:     History reviewed. No pertinent family history.      Objective:  Physical Exam: BP 124/70   Ht 5\' 10"  (1.778 m)   Wt 234 lb 12.8 oz (106.5 kg)   BMI 33.69 kg/m  Gen: NAD, comfortable in exam room Lungs: Breathing comfortably on room air Left lower extremity exam -Patient with surgical scar along the distal medial tibia. No other deformities noted -Patient with minimal motion of his ankle joint in all planes secondary to his ankle fusion -Significant tenderness palpation along the distal medial tibia -Patient's right foot is neurovascularly intact  Limited diagnostic ultrasound of the Left lower extremity Findings: -Increase in the amount of fluid overlying the distal  tibial cortex. There was no increased neovascularization. The fluid overlying the tibial cortex corresponded with patient's area of tenderness. No fractures were seen. Impression: -Ultrasound findings consistent with stress reaction   Assessment & Plan:  Patient is a 59 y.o. male here for follow-up of left distal tibia pain  1. Left distal tibia pain concerning for stress reaction of the tibia -Given patient's worsening of pain since his last visit we will proceed with getting an MRI to better evaluate for stress injury/fracture -We will discontinue use of Aircast as this has not improved his pain. -Patient was given a long  cam boot -Patient may continue exercising at the gym if you would like as long as this does not cause him pain. He was advised to stick with stationary bike and swimming for cardio if this does not bother his ankle. He also work out his upper body  Patient will follow up after the MRI  Addendum:  I was the preceptor for this visit and available for immediate consultation.  Karlton Lemon MD [facility]

## 2020-02-12 NOTE — Patient Instructions (Signed)
Is the pain in your tibia is not better I would like to get an MRI of your leg to better evaluate the bones and soft tissues. The ultrasound still showed some fluid overlying the bone which is concerning for stress injury. The MRI will help confirm this. -We will switch to your air cast for a long cam boot. This should help provide better support of your leg and improve your pain. -I will see back after the MRI. If you have any questions or concerns in the meantime please do not hesitate to call my clinic.

## 2020-02-19 ENCOUNTER — Ambulatory Visit: Payer: Medicare HMO | Admitting: Sports Medicine

## 2020-03-21 ENCOUNTER — Inpatient Hospital Stay: Admission: RE | Admit: 2020-03-21 | Payer: Medicare HMO | Source: Ambulatory Visit

## 2021-06-11 IMAGING — CR DG TIBIA/FIBULA 2V*L*
2 series · 2 of 2 positions shown · non-contrast
Comparison: None.

CLINICAL DATA: Medial left lower leg pain for the past 2 weeks. No
known injury. Previous ankle fusion at [facility] in 6517 with
hardware removed at [REDACTED] in 3838.

EXAM:
LEFT TIBIA AND FIBULA - 2 VIEW

[x tib-fib ap left]
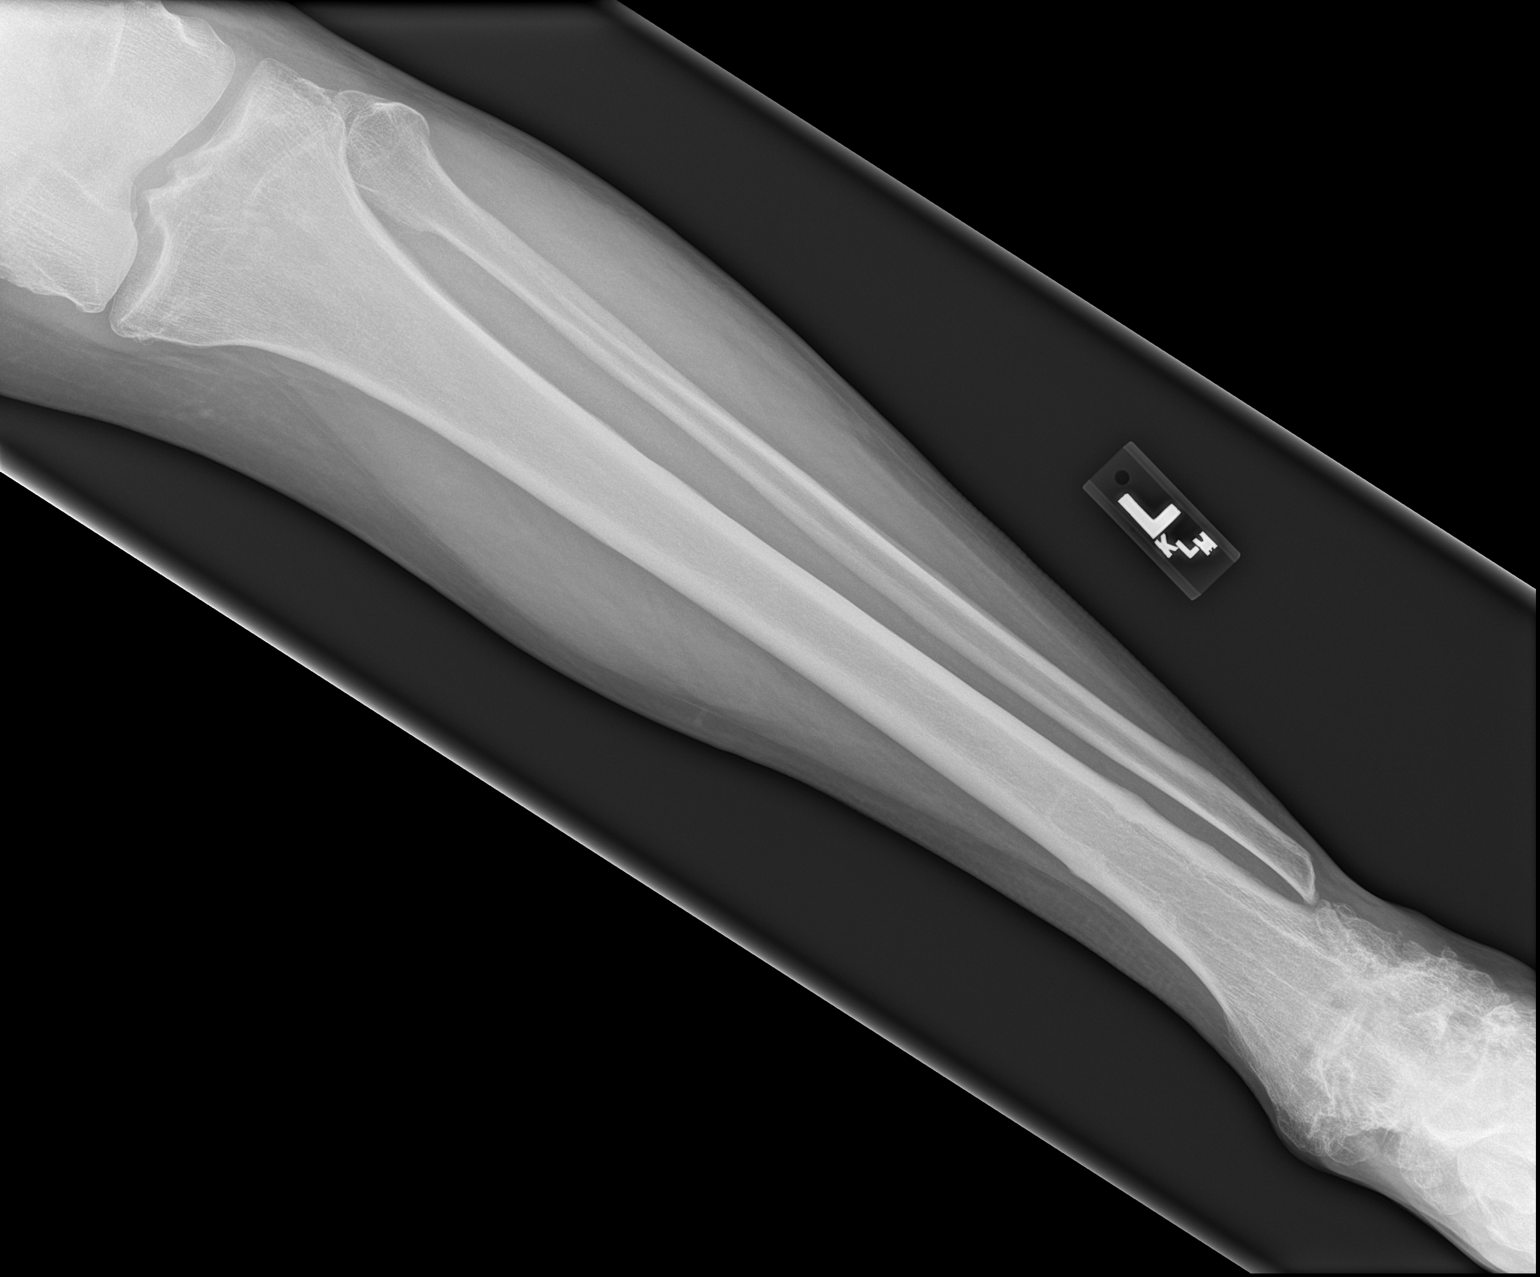

[x tib-fib lat left]
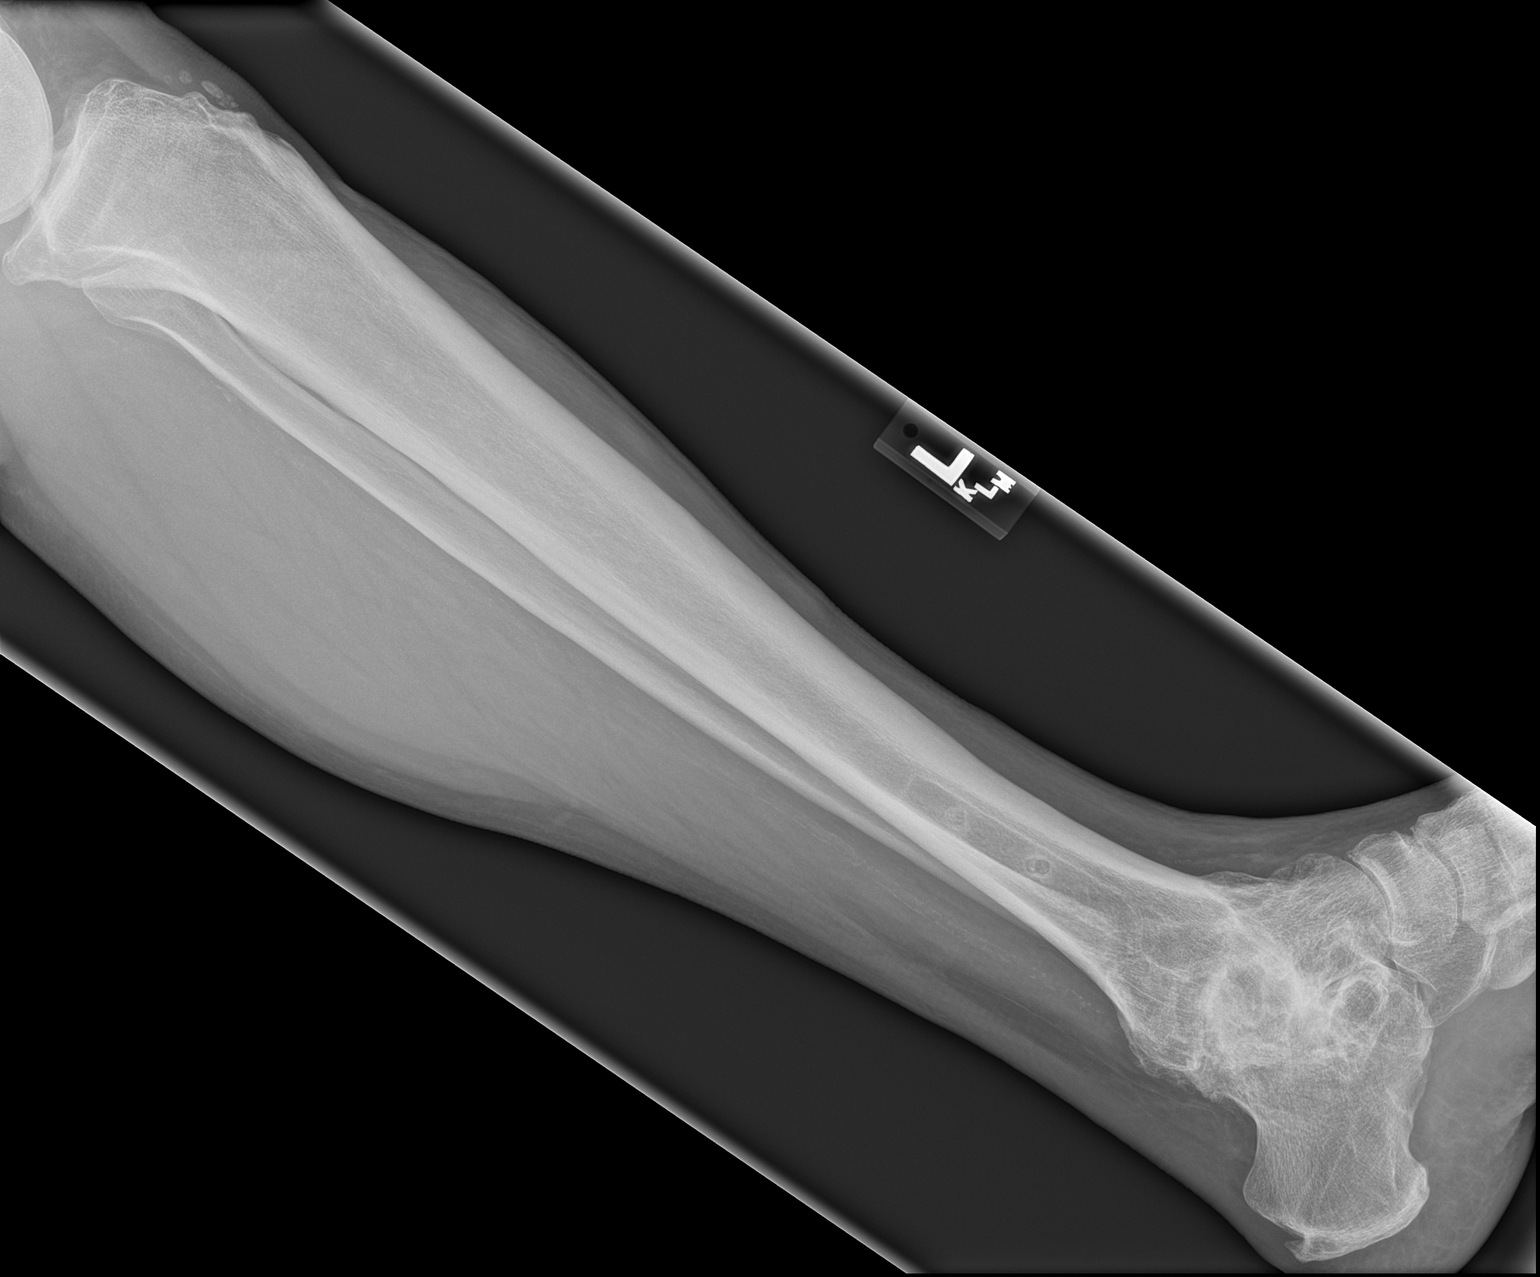

[2 of 2 positions shown; findings below may reference images not displayed]

FINDINGS: Surgical absence of the distal aspect of the fibula. Solid bone
fusion bridging the talotibial and subtalar joints. Post screw and
plate changes in the distal tibia. Moderate posterior calcaneal
enthesophyte formation. No fracture or dislocation.
IMPRESSION: 1. No acute abnormality.
2. Solid bone fusion at the talotibial and subtalar joints.
3. Post hardware fixation in the distal tibia.
